# Patient Record
Sex: Female | Born: 1960 | Race: Black or African American | Hispanic: No | Marital: Married | Smoking: Current some day smoker
Health system: Southern US, Community
[De-identification: ages and names within clinical notes are randomized; demographics above are authoritative.]

## PROBLEM LIST (undated history)

## (undated) DIAGNOSIS — E119 Type 2 diabetes mellitus without complications: Secondary | ICD-10-CM

## (undated) DIAGNOSIS — I10 Essential (primary) hypertension: Secondary | ICD-10-CM

---

## 2017-07-26 ENCOUNTER — Encounter (HOSPITAL_COMMUNITY): Payer: Self-pay

## 2017-07-26 ENCOUNTER — Emergency Department (HOSPITAL_COMMUNITY): Payer: BLUE CROSS/BLUE SHIELD

## 2017-07-26 ENCOUNTER — Emergency Department (HOSPITAL_COMMUNITY)
Admission: EM | Admit: 2017-07-26 | Discharge: 2017-07-26 | Disposition: A | Discharge status: Home / Self Care | Payer: BLUE CROSS/BLUE SHIELD | Attending: Emergency Medicine | Admitting: Emergency Medicine

## 2017-07-26 DIAGNOSIS — I10 Essential (primary) hypertension: Secondary | ICD-10-CM | POA: Diagnosis not present

## 2017-07-26 DIAGNOSIS — M7918 Myalgia, other site: Secondary | ICD-10-CM | POA: Insufficient documentation

## 2017-07-26 DIAGNOSIS — E119 Type 2 diabetes mellitus without complications: Secondary | ICD-10-CM | POA: Insufficient documentation

## 2017-07-26 DIAGNOSIS — F17200 Nicotine dependence, unspecified, uncomplicated: Secondary | ICD-10-CM | POA: Insufficient documentation

## 2017-07-26 HISTORY — DX: Essential (primary) hypertension: I10

## 2017-07-26 HISTORY — DX: Type 2 diabetes mellitus without complications: E11.9

## 2017-07-26 MED ORDER — METHOCARBAMOL 500 MG PO TABS: 500.0000 mg | ORAL_TABLET | Freq: Two times a day (BID) | ORAL | 0 refills | Status: DC

## 2017-07-26 MED ORDER — METHOCARBAMOL 500 MG PO TABS
500.0000 mg | ORAL_TABLET | Freq: Once | ORAL | Status: AC
  Administered 2017-07-26: 500 mg via ORAL
  Filled 2017-07-26: qty 1

## 2017-07-26 MED ORDER — IBUPROFEN 400 MG PO TABS
600.0000 mg | ORAL_TABLET | Freq: Once | ORAL | Status: AC
  Administered 2017-07-26: 600 mg via ORAL
  Filled 2017-07-26: qty 1
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
## 2017-07-26 NOTE — Discharge Instructions (Addendum)
Please read attached information. If you experience any new or worsening signs or symptoms please return to the emergency room for evaluation. Please follow-up with your primary care provider or specialist as discussed. Please use medication prescribed only as directed and discontinue taking if you have any concerning signs or symptoms.   °
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
## 2019-03-17 ENCOUNTER — Ambulatory Visit: Payer: Self-pay | Attending: Internal Medicine

## 2019-03-17 DIAGNOSIS — Z23 Encounter for immunization: Secondary | ICD-10-CM | POA: Insufficient documentation

## 2019-03-17 NOTE — Progress Notes (Signed)
   Covid-19 Vaccination Clinic  Name:  Trinetta Alemu    MRN: [id number] DOB: 21-Apr-1960  03/17/2019  Ms. Betsch was observed post Covid-19 immunization for 15 minutes without incidence. She was provided with Vaccine Information Sheet and instruction to access the V-Safe system.   Ms. Silman was instructed to call 911 with any severe reactions post vaccine: Marland Kitchen Difficulty breathing  . Swelling of your face and throat  . A fast heartbeat  . A bad rash all over your body  . Dizziness and weakness    Immunizations Administered    Name Date Dose VIS Date Route   Pfizer COVID-19 Vaccine 03/17/2019  9:48 AM 0.3 mL 12/29/2018 Intramuscular   Manufacturer: [employer], [employer]   Lot: [id number]   NDC: [address]-6

## 2019-03-24 ENCOUNTER — Emergency Department (HOSPITAL_COMMUNITY): Payer: 59

## 2019-03-24 ENCOUNTER — Other Ambulatory Visit: Payer: Self-pay

## 2019-03-24 ENCOUNTER — Encounter (HOSPITAL_COMMUNITY): Payer: Self-pay | Admitting: *Deleted

## 2019-03-24 ENCOUNTER — Emergency Department (HOSPITAL_COMMUNITY)
Admission: EM | Admit: 2019-03-24 | Discharge: 2019-03-25 | Disposition: A | Discharge status: Home / Self Care | Payer: 59 | Attending: Emergency Medicine | Admitting: Emergency Medicine

## 2019-03-24 DIAGNOSIS — R519 Headache, unspecified: Secondary | ICD-10-CM | POA: Diagnosis present

## 2019-03-24 DIAGNOSIS — I10 Essential (primary) hypertension: Secondary | ICD-10-CM | POA: Diagnosis not present

## 2019-03-24 DIAGNOSIS — R0602 Shortness of breath: Secondary | ICD-10-CM | POA: Diagnosis not present

## 2019-03-24 DIAGNOSIS — R0981 Nasal congestion: Secondary | ICD-10-CM | POA: Diagnosis not present

## 2019-03-24 DIAGNOSIS — E119 Type 2 diabetes mellitus without complications: Secondary | ICD-10-CM | POA: Diagnosis not present

## 2019-03-24 DIAGNOSIS — Z7984 Long term (current) use of oral hypoglycemic drugs: Secondary | ICD-10-CM | POA: Diagnosis not present

## 2019-03-24 DIAGNOSIS — Z566 Other physical and mental strain related to work: Secondary | ICD-10-CM

## 2019-03-24 DIAGNOSIS — R05 Cough: Secondary | ICD-10-CM | POA: Insufficient documentation

## 2019-03-24 DIAGNOSIS — J069 Acute upper respiratory infection, unspecified: Secondary | ICD-10-CM

## 2019-03-24 DIAGNOSIS — Z20822 Contact with and (suspected) exposure to covid-19: Secondary | ICD-10-CM | POA: Diagnosis not present

## 2019-03-24 DIAGNOSIS — Z79899 Other long term (current) drug therapy: Secondary | ICD-10-CM | POA: Diagnosis not present

## 2019-03-24 DIAGNOSIS — F17200 Nicotine dependence, unspecified, uncomplicated: Secondary | ICD-10-CM | POA: Diagnosis not present

## 2019-03-24 LAB — CBC
HCT: 40.4 % (ref 36.0–46.0)
Hemoglobin: 13.1 g/dL (ref 12.0–15.0)
MCH: 27.6 pg (ref 26.0–34.0)
MCHC: 32.4 g/dL (ref 30.0–36.0)
MCV: 85.1 fL (ref 80.0–100.0)
Platelets: 353 10*3/uL (ref 150–400)
RBC: 4.75 MIL/uL (ref 3.87–5.11)
RDW: 15.6 % — ABNORMAL HIGH (ref 11.5–15.5)
WBC: 8.3 10*3/uL (ref 4.0–10.5)
nRBC: 0 % (ref 0.0–0.2)

## 2019-03-24 LAB — BASIC METABOLIC PANEL
Anion gap: 10 (ref 5–15)
BUN: 13 mg/dL (ref 6–20)
CO2: 28 mmol/L (ref 22–32)
Calcium: 9.2 mg/dL (ref 8.9–10.3)
Chloride: 105 mmol/L (ref 98–111)
Creatinine, Ser: 1.03 mg/dL — ABNORMAL HIGH (ref 0.44–1.00)
GFR calc Af Amer: >60 mL/min (ref >60)
GFR calc non Af Amer: 59 mL/min — ABNORMAL LOW (ref >60)
Glucose, Bld: 136 mg/dL — ABNORMAL HIGH (ref 70–99)
Potassium: 4 mmol/L (ref 3.5–5.1)
Sodium: 143 mmol/L (ref 135–145)

## 2019-03-24 LAB — TROPONIN I (HIGH SENSITIVITY): Troponin I (High Sensitivity): 6 ng/L (ref <18)

## 2019-03-24 LAB — CBG MONITORING, ED: Glucose-Capillary: 138 mg/dL — ABNORMAL HIGH (ref 70–99)

## 2019-03-24 MED ORDER — SODIUM CHLORIDE 0.9% FLUSH: 3.0000 mL | Freq: Once | INTRAVENOUS | Status: DC

## 2019-03-24 NOTE — ED Triage Notes (Signed)
Headache for three weeks, cough, dizziness, weakness, chest pain, and congestion that started today. Unsure of fevers.

## 2019-03-25 ENCOUNTER — Emergency Department (HOSPITAL_COMMUNITY): Payer: 59

## 2019-03-25 LAB — SARS CORONAVIRUS 2 (TAT 6-24 HRS): SARS Coronavirus 2: NEGATIVE

## 2019-03-25 LAB — TROPONIN I (HIGH SENSITIVITY): Troponin I (High Sensitivity): 4 ng/L (ref <18)

## 2019-03-25 MED ORDER — METOCLOPRAMIDE HCL 5 MG/ML IJ SOLN
5.0000 mg | Freq: Once | INTRAMUSCULAR | Status: AC
  Administered 2019-03-25: 5 mg via INTRAMUSCULAR
  Filled 2019-03-25: qty 2

## 2019-03-25 MED ORDER — DIPHENHYDRAMINE HCL 50 MG/ML IJ SOLN
25.0000 mg | Freq: Once | INTRAMUSCULAR | Status: AC
  Administered 2019-03-25: 25 mg via INTRAMUSCULAR
  Filled 2019-03-25: qty 1

## 2019-03-25 MED ORDER — KETOROLAC TROMETHAMINE 30 MG/ML IJ SOLN
30.0000 mg | Freq: Once | INTRAMUSCULAR | Status: AC
  Administered 2019-03-25: 30 mg via INTRAMUSCULAR
  Filled 2019-03-25: qty 1

## 2019-03-25 NOTE — Discharge Instructions (Addendum)
Schedule follow-up with your primary care doctor for recheck of your blood pressure.

## 2019-03-25 NOTE — ED Provider Notes (Signed)
[facility] EMERGENCY DEPARTMENT Provider Note   CSN: [id number] Arrival date & time: 03/24/19  2100     History Chief Complaint  Patient presents with  . Chest Pain    Kristy Erickson is a 59 y.o. female.  Patient presents to the emergency department with multiple complaints.  Patient reports that she has been having daily headaches for 3 weeks.  She reports that she has been under a lot of stress at a new job and thinks this might be causing her headaches.  She generally wakes up with a headache and takes Tylenol.  By midday the headache has resolved when the Tylenol wears off the headache returns.  Headache is at times global, at other times in different areas of the head.  No vision change, neck pain, neck stiffness, fever.  She does not usually have headaches.  Over the last 24 hours she has now developed sinus congestion, cough, feeling shortness of breath.  She has not had any fevers.        Past Medical History:  Diagnosis Date  . Diabetes mellitus without complication (HCC)   . Hypertension     There are no problems to display for this patient.   History reviewed. No pertinent surgical history.   OB History   No obstetric history on file.     No family history on file.  Social History   Tobacco Use  . Smoking status: Current Some Day Smoker  . Smokeless tobacco: Never Used  Substance Use Topics  . Alcohol use: Not on file  . Drug use: Not on file    Home Medications Prior to Admission medications   Medication Sig Start Date End Date Taking? Authorizing Provider  atorvastatin (LIPITOR) 80 MG tablet Take 80 mg by mouth daily. 11/21/18  Yes [provider]  glimepiride (AMARYL) 4 MG tablet Take 4 mg by mouth daily. 01/14/19  Yes [provider]  metoprolol succinate (TOPROL-XL) 50 MG 24 hr tablet Take 50 mg by mouth daily. 11/21/18  Yes [provider]  pioglitazone (ACTOS) 30 MG tablet Take 30 mg by mouth daily.  11/21/18  Yes [provider]  methocarbamol (ROBAXIN) 500 MG tablet Take 1 tablet (500 mg total) by mouth 2 (two) times daily. Patient not taking: Reported on 03/25/2019 07/26/17   Eyvonne Mechanic, PA-C    Allergies    Patient has no known allergies.  Review of Systems   Review of Systems  HENT: Positive for congestion.   Respiratory: Positive for cough and shortness of breath.   Neurological: Positive for headaches.  All other systems reviewed and are negative.   Physical Exam Updated Vital Signs BP (!) 176/90   Pulse 73   Temp 98.2 F (36.8 C) (Oral)   Resp 18   Ht 5\' 2"  (1.575 m)   Wt 73.5 kg   SpO2 100%   BMI 29.63 kg/m   Physical Exam Vitals and nursing note reviewed.  Constitutional:      General: She is not in acute distress.    Appearance: Normal appearance. She is well-developed.  HENT:     Head: Normocephalic and atraumatic.     Right Ear: Hearing normal.     Left Ear: Hearing normal.     Nose: Nose normal.  Eyes:     Conjunctiva/sclera: Conjunctivae normal.     Pupils: Pupils are equal, round, and reactive to light.  Cardiovascular:     Rate and Rhythm: Regular rhythm.  Heart sounds: S1 normal and S2 normal. No murmur. No friction rub. No gallop.   Pulmonary:     Effort: Pulmonary effort is normal. No respiratory distress.     Breath sounds: Normal breath sounds.  Chest:     Chest wall: No tenderness.  Abdominal:     General: Bowel sounds are normal.     Palpations: Abdomen is soft.     Tenderness: There is no abdominal tenderness. There is no guarding or rebound. Negative signs include Murphy's sign and McBurney's sign.     Hernia: No hernia is present.  Musculoskeletal:        General: Normal range of motion.     Cervical back: Normal range of motion and neck supple.  Skin:    General: Skin is warm and dry.     Findings: No rash.  Neurological:     Mental Status: She is alert and oriented to person, place, and time.     GCS: GCS eye  subscore is 4. GCS verbal subscore is 5. GCS motor subscore is 6.     Cranial Nerves: No cranial nerve deficit.     Sensory: No sensory deficit.     Coordination: Coordination normal.  Psychiatric:        Speech: Speech normal.        Behavior: Behavior normal.        Thought Content: Thought content normal.     ED Results / Procedures / Treatments   Labs (all labs ordered are listed, but only abnormal results are displayed) Labs Reviewed  BASIC METABOLIC PANEL - Abnormal; Notable for the following components:      Result Value   Glucose, Bld 136 (*)    Creatinine, Ser 1.03 (*)    GFR calc non Af Amer 59 (*)    All other components within normal limits  CBC - Abnormal; Notable for the following components:   RDW 15.6 (*)    All other components within normal limits  CBG MONITORING, ED - Abnormal; Notable for the following components:   Glucose-Capillary 138 (*)    All other components within normal limits  TROPONIN I (HIGH SENSITIVITY)  TROPONIN I (HIGH SENSITIVITY)    EKG EKG Interpretation  Date/Time:  Sunday March 25 2019 03:56:41 EST Ventricular Rate:  70 PR Interval:  122 QRS Duration: 79 QT Interval:  472 QTC Calculation: 510 R Axis:   10 Text Interpretation: Sinus rhythm Probable left atrial enlargement Consider anterior infarct Confirmed by Gilda Crease [phone]) on 03/25/2019 5:07:43 AM   Radiology CT HEAD WO CONTRAST  Result Date: 03/25/2019 CLINICAL DATA:  Acute headache with normal neurological exam. EXAM: CT HEAD WITHOUT CONTRAST TECHNIQUE: Contiguous axial images were obtained from the base of the skull through the vertex without intravenous contrast. COMPARISON:  None. FINDINGS: Brain: The brain shows a normal appearance without evidence of malformation, atrophy, old or acute small or large vessel infarction, mass lesion, hemorrhage, hydrocephalus or extra-axial collection. Vascular: No hyperdense vessel. No evidence of atherosclerotic calcification.  Skull: Normal.  No traumatic finding.  No focal bone lesion. Sinuses/Orbits: Sinuses are clear. Orbits appear normal. Mastoids are clear. Other: None significant IMPRESSION: Normal head CT.  No cause of headache identified. Electronically Signed   By: Paulina Fusi M.D.   On: 03/25/2019 05:20   DG Chest Portable 1 View  Result Date: 03/24/2019 CLINICAL DATA:  Chest pain and cough. EXAM: PORTABLE CHEST 1 VIEW COMPARISON:  None. FINDINGS: The heart size and mediastinal  contours are within normal limits. Both lungs are clear. The visualized skeletal structures are unremarkable. IMPRESSION: No active disease. Electronically Signed   By: Constance Holster M.D.   On: 03/24/2019 21:41    Procedures Procedures (including critical care time)  Medications Ordered in ED Medications  sodium chloride flush (NS) 0.9 % injection 3 mL (has no administration in time range)  ketorolac (TORADOL) 30 MG/ML injection 30 mg (has no administration in time range)  metoCLOPramide (REGLAN) injection 5 mg (has no administration in time range)  diphenhydrAMINE (BENADRYL) injection 25 mg (has no administration in time range)    ED Course  I have reviewed the triage vital signs and the nursing notes.  Pertinent labs & imaging results that were available during my care of the patient were reviewed by me and considered in my medical decision making (see chart for details).    MDM Rules/Calculators/A&P                      Patient complaining of 3 weeks of daily headache.  Headache does resolve with OTC pain medications but then comes back.  Headache is generally gradual in onset, no red flag symptoms.  She does not, however, regularly suffer headaches.  She therefore underwent CT head which was unremarkable.  She has a normal neurologic exam.  Treated with Toradol, Reglan, Benadryl and can follow-up as an outpatient with primary care.  Patient also complaining of URI symptoms.  She does have nasal congestion and cough.   Cough is nonproductive, witnessed in the exam room.  Chest x-ray is clear.  Lungs are clear to auscultation.  Symptoms are not consistent with cardiac etiology and her work-up has been negative.  Will provide Covid testing, treat URI symptoms. Final Clinical Impression(s) / ED Diagnoses Final diagnoses:  Nonintractable headache, unspecified chronicity pattern, unspecified headache type  Upper respiratory tract infection, unspecified type  Stress at work    Rx / DC Orders ED Discharge Orders    None       Johntavius Shepard, Gwenyth Allegra, MD 03/25/19 [phone]

## 2019-04-07 ENCOUNTER — Ambulatory Visit: Payer: 59 | Attending: Internal Medicine

## 2019-04-07 DIAGNOSIS — Z23 Encounter for immunization: Secondary | ICD-10-CM

## 2019-04-07 NOTE — Progress Notes (Signed)
   Covid-19 Vaccination Clinic  Name:  Kristy Erickson    MRN: [id number] DOB: 07/10/1960  04/07/2019  Kristy Erickson was observed post Covid-19 immunization for 15 minutes without incident. She was provided with Vaccine Information Sheet and instruction to access the V-Safe system.   Kristy Erickson was instructed to call 911 with any severe reactions post vaccine: Marland Kitchen Difficulty breathing  . Swelling of face and throat  . A fast heartbeat  . A bad rash all over body  . Dizziness and weakness   Immunizations Administered    Name Date Dose VIS Date Route   Pfizer COVID-19 Vaccine 04/07/2019 10:14 AM 0.3 mL 12/29/2018 Intramuscular   Manufacturer: [employer], [employer]   Lot: [id number]   NDC: [address]-9

## 2019-04-11 ENCOUNTER — Ambulatory Visit: Payer: Self-pay

## 2019-05-04 ENCOUNTER — Encounter (HOSPITAL_COMMUNITY): Payer: Self-pay

## 2019-05-04 ENCOUNTER — Ambulatory Visit (HOSPITAL_COMMUNITY)
Admission: EM | Admit: 2019-05-04 | Discharge: 2019-05-04 | Disposition: A | Discharge status: Home / Self Care | Payer: 59 | Attending: Emergency Medicine | Admitting: Emergency Medicine

## 2019-05-04 ENCOUNTER — Other Ambulatory Visit: Payer: Self-pay

## 2019-05-04 DIAGNOSIS — R519 Headache, unspecified: Secondary | ICD-10-CM

## 2019-05-04 DIAGNOSIS — J302 Other seasonal allergic rhinitis: Secondary | ICD-10-CM | POA: Diagnosis not present

## 2019-05-04 LAB — GLUCOSE, CAPILLARY: Glucose-Capillary: 139 mg/dL — ABNORMAL HIGH (ref 70–99)

## 2019-05-04 LAB — CBG MONITORING, ED: Glucose-Capillary: 139 mg/dL — ABNORMAL HIGH (ref 70–99)

## 2019-05-04 MED ORDER — CETIRIZINE HCL 10 MG PO CAPS: 10.0000 mg | ORAL_CAPSULE | Freq: Every day | ORAL | 0 refills | Status: AC

## 2019-05-04 MED ORDER — BENZONATATE 200 MG PO CAPS: 200.0000 mg | ORAL_CAPSULE | Freq: Three times a day (TID) | ORAL | 0 refills | Status: AC | PRN

## 2019-05-04 MED ORDER — PIOGLITAZONE HCL 30 MG PO TABS: 30.0000 mg | ORAL_TABLET | Freq: Every day | ORAL | 0 refills | Status: AC

## 2019-05-04 MED ORDER — NAPROXEN 500 MG PO TABS: 500.0000 mg | ORAL_TABLET | Freq: Two times a day (BID) | ORAL | 0 refills | Status: AC | PRN

## 2019-05-04 MED ORDER — FLUTICASONE PROPIONATE 50 MCG/ACT NA SUSP: 1.0000 | Freq: Every day | NASAL | 0 refills | Status: AC

## 2019-05-04 NOTE — Discharge Instructions (Signed)
May try using cetirizine/Zyrtec as alternative to Claritin/loratadine Add in Flonase nasal spray 1 to 2 spray in each nostril daily Tessalon/benzonatate every 8 hours for cough Naprosyn twice daily for headache  Please continue blood pressure medicine as prescribed, monitor blood pressure at home, follow-up with primary care for blood pressure recheck in 1 to 2 weeks Please restart taking diabetes medicine-blood sugar today 139  If you develop any worsening headache, vision changes, difficulty speaking, confusion, weakness, chest pain please follow-up in the emergency room

## 2019-05-04 NOTE — ED Provider Notes (Addendum)
[facility]    CSN: [id number] Arrival date & time: 05/04/19  1045      History   Chief Complaint Chief Complaint  Patient presents with  . Headache  . Dizziness    HPI Kristy Erickson is a 59 y.o. female history of hypertension, DM type II, presenting today for evaluation of headache and allergy symptoms.  Patient notes that she has had a frequent headache for the past 1 to 2 months.  She has had daily headaches, today her headache is located in her left temporal area, but are migratory around all areas of head.  Will have occasional blurry vision, but denies at present.  Denies associated one-sided weakness.  Does report generalized fatigue/weakness.  Denies fevers chills or body aches.  Denies chest pain or shortness of breath.  Has had some nasal congestion/rhinorrhea along with postnasal drainage.  Recently moved here from [place] and feels her allergies are worse than they have ever been in the past.  Using Claritin without relief.  Does have associated cough.  She took some Robitussin last night, believes this is the cause of her elevated blood pressure today.  States that it has been normal recently.  She does report that she has not been taking her blood pressure medicine of recently.  Did take metoprolol today.  Reports recent stress in her life.  Was seen in emergency room 1 month ago for similar and had negative CT scan of head.  Had negative Covid test at that time.  Plans to get established with PCP here. [facility] plans to work on this.   HPI  Past Medical History:  Diagnosis Date  . Diabetes mellitus without complication (HCC)   . Hypertension     There are no problems to display for this patient.   History reviewed. No pertinent surgical history.  OB History   No obstetric history on file.      Home Medications    Prior to Admission medications   Medication Sig Start Date End Date Taking? Authorizing Provider  atorvastatin (LIPITOR) 80 MG  tablet Take 80 mg by mouth daily. 11/21/18   [provider]  benzonatate (TESSALON) 200 MG capsule Take 1 capsule (200 mg total) by mouth 3 (three) times daily as needed for up to 7 days for cough. 05/04/19 05/11/19  Cong Hightower C, PA-C  Cetirizine HCl 10 MG CAPS Take 1 capsule (10 mg total) by mouth daily for 10 days. 05/04/19 05/14/19  Kolette Vey C, PA-C  fluticasone (FLONASE) 50 MCG/ACT nasal spray Place 1-2 sprays into both nostrils daily for 7 days. 05/04/19 05/11/19  Nakira Litzau C, PA-C  glimepiride (AMARYL) 4 MG tablet Take 4 mg by mouth daily. 01/14/19   [provider]  metoprolol succinate (TOPROL-XL) 50 MG 24 hr tablet Take 50 mg by mouth daily. 11/21/18   [provider]  naproxen (NAPROSYN) 500 MG tablet Take 1 tablet (500 mg total) by mouth 2 (two) times daily as needed for headache. 05/04/19   Lynisha Osuch C, PA-C  pioglitazone (ACTOS) 30 MG tablet Take 1 tablet (30 mg total) by mouth daily. 05/04/19   Vernestine Brodhead, Junius Creamer, PA-C    Family History History reviewed. No pertinent family history.  Social History Social History   Tobacco Use  . Smoking status: Current Some Day Smoker  . Smokeless tobacco: Never Used  Substance Use Topics  . Alcohol use: Not on file  . Drug use: Not on file     Allergies  Patient has no known allergies.   Review of Systems Review of Systems  Constitutional: Positive for fatigue. Negative for fever.  HENT: Positive for congestion and rhinorrhea. Negative for sinus pressure and sore throat.   Eyes: Negative for photophobia, pain and visual disturbance.  Respiratory: Positive for cough. Negative for shortness of breath.   Cardiovascular: Negative for chest pain.  Gastrointestinal: Negative for abdominal pain, nausea and vomiting.  Genitourinary: Negative for decreased urine volume and hematuria.  Musculoskeletal: Negative for myalgias, neck pain and neck stiffness.  Neurological: Positive for headaches.  Negative for dizziness, syncope, facial asymmetry, speech difficulty, weakness, light-headedness and numbness.     Physical Exam Triage Vital Signs ED Triage Vitals  Enc Vitals Group     BP 05/04/19 1123 (!) 194/90     Pulse Rate 05/04/19 1123 84     Resp 05/04/19 1123 19     Temp 05/04/19 1123 98.5 F (36.9 C)     Temp Source 05/04/19 1123 Oral     SpO2 05/04/19 1123 99 %     Weight --      Height --      Head Circumference --      Peak Flow --      Pain Score 05/04/19 1121 10     Pain Loc --      Pain Edu? --      Excl. in GC? --    No data found.  Updated Vital Signs BP (!) 194/90 (BP Location: Right Arm)   Pulse 84   Temp 98.5 F (36.9 C) (Oral)   Resp 19   SpO2 99%   Visual Acuity Right Eye Distance:   Left Eye Distance:   Bilateral Distance:    Right Eye Near:   Left Eye Near:    Bilateral Near:     Physical Exam Vitals and nursing note reviewed.  Constitutional:      General: She is not in acute distress.    Appearance: She is well-developed.  HENT:     Head: Normocephalic and atraumatic.     Ears:     Comments: Bilateral ears without tenderness to palpation of external auricle, tragus and mastoid, EAC's without erythema or swelling, TM's with good bony landmarks and cone of light. Non erythematous.     Nose:     Comments: Nasal mucosa pink, swollen turbinates, more prominent on right    Mouth/Throat:     Comments: Oral mucosa pink and moist, no tonsillar enlargement or exudate. Posterior pharynx patent and nonerythematous, no uvula deviation or swelling. Normal phonation. Eyes:     Extraocular Movements: Extraocular movements intact.     Conjunctiva/sclera: Conjunctivae normal.     Pupils: Pupils are equal, round, and reactive to light.  Cardiovascular:     Rate and Rhythm: Normal rate and regular rhythm.     Heart sounds: No murmur.  Pulmonary:     Effort: Pulmonary effort is normal. No respiratory distress.     Breath sounds: Normal breath  sounds.     Comments: Breathing comfortably at rest, CTABL, no wheezing, rales or other adventitious sounds auscultated Abdominal:     Palpations: Abdomen is soft.     Tenderness: There is no abdominal tenderness.  Musculoskeletal:     Cervical back: Neck supple.  Skin:    General: Skin is warm and dry.  Neurological:     General: No focal deficit present.     Mental Status: She is alert and oriented to person, place,  and time. Mental status is at baseline.     Comments: Patient A&O x3, cranial nerves II-XII grossly intact, strength at shoulders, hips and knees 5/5, equal bilaterally, patellar reflex 2+ bilaterally.Negative Romberg . Gait without abnormality.      UC Treatments / Results  Labs (all labs ordered are listed, but only abnormal results are displayed) Labs Reviewed  GLUCOSE, CAPILLARY - Abnormal; Notable for the following components:      Result Value   Glucose-Capillary 139 (*)    All other components within normal limits  CBG MONITORING, ED - Abnormal; Notable for the following components:   Glucose-Capillary 139 (*)    All other components within normal limits    EKG   Radiology No results found.  Procedures Procedures (including critical care time)  Medications Ordered in UC Medications - No data to display  Initial Impression / Assessment and Plan / UC Course  I have reviewed the triage vital signs and the nursing notes.  Pertinent labs & imaging results that were available during my care of the patient were reviewed by me and considered in my medical decision making (see chart for details).     Blood pressure elevated today, remaining elevated at 192/93 on recheck.  No neuro deficits, no red flags for headache.  Offered migraine cocktail, patient wished to proceed mainly with oral medicines.  Will provide Naprosyn for headache.  Continue blood pressure medicine as prescribed and establish care with PCP for blood pressure recheck in 1 to 2 weeks.   Discussed warning signs to follow-up with emergency room for.  Also with persistent allergy symptoms.  Will switch to Zyrtec as alternative and try Flonase, Tessalon for cough.  Rest and fluids.  Refilled pioglitazone.  Blood sugar today 139.  Follow-up with PCP for further diabetes monitoring and management.  Discussed strict return precautions. Patient verbalized understanding and is agreeable with plan.  Final Clinical Impressions(s) / UC Diagnoses   Final diagnoses:  Acute nonintractable headache, unspecified headache type  Seasonal allergic rhinitis, unspecified trigger     Discharge Instructions     May try using cetirizine/Zyrtec as alternative to Claritin/loratadine Add in Flonase nasal spray 1 to 2 spray in each nostril daily Tessalon/benzonatate every 8 hours for cough Naprosyn twice daily for headache  Please continue blood pressure medicine as prescribed, monitor blood pressure at home, follow-up with primary care for blood pressure recheck in 1 to 2 weeks Please restart taking diabetes medicine-blood sugar today 139  If you develop any worsening headache, vision changes, difficulty speaking, confusion, weakness, chest pain please follow-up in the emergency room   ED Prescriptions    Medication Sig Dispense Auth. Provider   pioglitazone (ACTOS) 30 MG tablet Take 1 tablet (30 mg total) by mouth daily. 30 tablet Shanetta Nicolls C, PA-C   naproxen (NAPROSYN) 500 MG tablet Take 1 tablet (500 mg total) by mouth 2 (two) times daily as needed for headache. 30 tablet Modupe Shampine C, PA-C   benzonatate (TESSALON) 200 MG capsule Take 1 capsule (200 mg total) by mouth 3 (three) times daily as needed for up to 7 days for cough. 28 capsule Dominyk Law C, PA-C   fluticasone (FLONASE) 50 MCG/ACT nasal spray Place 1-2 sprays into both nostrils daily for 7 days. 1 g Aryan Sparks C, PA-C   Cetirizine HCl 10 MG CAPS Take 1 capsule (10 mg total) by mouth daily for 10 days. 10  capsule Willies Laviolette, Gamaliel C, PA-C     PDMP not reviewed  this encounter.   Joneen Caraway [place] C, PA-C 05/04/19 1326    Debara Pickett C, PA-C 05/04/19 1327

## 2019-05-04 NOTE — ED Triage Notes (Signed)
Pt presents to UC with headache x 1 month, lightheaded x 3 days.Pt reports she feels lightheaded when she is standing up.

## 2019-05-06 ENCOUNTER — Encounter (HOSPITAL_COMMUNITY): Payer: Self-pay

## 2019-05-06 ENCOUNTER — Other Ambulatory Visit: Payer: Self-pay

## 2019-05-06 ENCOUNTER — Emergency Department (HOSPITAL_COMMUNITY)
Admission: EM | Admit: 2019-05-06 | Discharge: 2019-05-06 | Disposition: A | Discharge status: Home / Self Care | Payer: 59 | Attending: Emergency Medicine | Admitting: Emergency Medicine

## 2019-05-06 DIAGNOSIS — Z7984 Long term (current) use of oral hypoglycemic drugs: Secondary | ICD-10-CM | POA: Diagnosis not present

## 2019-05-06 DIAGNOSIS — E119 Type 2 diabetes mellitus without complications: Secondary | ICD-10-CM | POA: Diagnosis not present

## 2019-05-06 DIAGNOSIS — F17210 Nicotine dependence, cigarettes, uncomplicated: Secondary | ICD-10-CM | POA: Diagnosis not present

## 2019-05-06 DIAGNOSIS — Z79899 Other long term (current) drug therapy: Secondary | ICD-10-CM | POA: Insufficient documentation

## 2019-05-06 DIAGNOSIS — R519 Headache, unspecified: Secondary | ICD-10-CM | POA: Diagnosis not present

## 2019-05-06 DIAGNOSIS — Z20822 Contact with and (suspected) exposure to covid-19: Secondary | ICD-10-CM | POA: Diagnosis not present

## 2019-05-06 DIAGNOSIS — I10 Essential (primary) hypertension: Secondary | ICD-10-CM | POA: Diagnosis present

## 2019-05-06 LAB — CBC WITH DIFFERENTIAL/PLATELET
Abs Immature Granulocytes: 0.03 10*3/uL (ref 0.00–0.07)
Basophils Absolute: 0 10*3/uL (ref 0.0–0.1)
Basophils Relative: 0 %
Eosinophils Absolute: 0.3 10*3/uL (ref 0.0–0.5)
Eosinophils Relative: 3 %
HCT: 42.5 % (ref 36.0–46.0)
Hemoglobin: 13.5 g/dL (ref 12.0–15.0)
Immature Granulocytes: 0 %
Lymphocytes Relative: 30 %
Lymphs Abs: 2.9 10*3/uL (ref 0.7–4.0)
MCH: 28.1 pg (ref 26.0–34.0)
MCHC: 31.8 g/dL (ref 30.0–36.0)
MCV: 88.4 fL (ref 80.0–100.0)
Monocytes Absolute: 0.5 10*3/uL (ref 0.1–1.0)
Monocytes Relative: 6 %
Neutro Abs: 5.8 10*3/uL (ref 1.7–7.7)
Neutrophils Relative %: 61 %
Platelets: 365 10*3/uL (ref 150–400)
RBC: 4.81 MIL/uL (ref 3.87–5.11)
RDW: 15.1 % (ref 11.5–15.5)
WBC: 9.6 10*3/uL (ref 4.0–10.5)
nRBC: 0 % (ref 0.0–0.2)

## 2019-05-06 LAB — BASIC METABOLIC PANEL
Anion gap: 8 (ref 5–15)
BUN: 16 mg/dL (ref 6–20)
CO2: 30 mmol/L (ref 22–32)
Calcium: 9.7 mg/dL (ref 8.9–10.3)
Chloride: 107 mmol/L (ref 98–111)
Creatinine, Ser: 1.04 mg/dL — ABNORMAL HIGH (ref 0.44–1.00)
GFR calc Af Amer: >60 mL/min (ref >60)
GFR calc non Af Amer: 58 mL/min — ABNORMAL LOW (ref >60)
Glucose, Bld: 134 mg/dL — ABNORMAL HIGH (ref 70–99)
Potassium: 4.4 mmol/L (ref 3.5–5.1)
Sodium: 145 mmol/L (ref 135–145)

## 2019-05-06 MED ORDER — HYDROCHLOROTHIAZIDE 12.5 MG PO TABS: 12.5000 mg | ORAL_TABLET | Freq: Every day | ORAL | 0 refills | Status: AC

## 2019-05-06 MED ORDER — HYDROCHLOROTHIAZIDE 12.5 MG PO CAPS
12.5000 mg | ORAL_CAPSULE | Freq: Once | ORAL | Status: AC
  Administered 2019-05-06: 12.5 mg via ORAL
  Filled 2019-05-06: qty 1

## 2019-05-06 NOTE — ED Triage Notes (Signed)
Seen at urgent care yesterday for headache of a month. They gave her a rx naproxen for headache. Still having ongoing hypertension not responding to her current meds.

## 2019-05-06 NOTE — ED Notes (Signed)
Spoke to lab about delay in blood work, stating they have the samples but are delayed.

## 2019-05-06 NOTE — ED Provider Notes (Signed)
[facility] DEPT Provider Note   CSN: [id number] Arrival date & time: 05/06/19  1908     History Chief Complaint  Patient presents with  . Hypertension    Kristy Erickson is a 59 y.o. female.  HPI      Kristy Erickson is a 59 y.o. female, with a history of DM and HTN, presenting to the ED with headaches for the last month or two, but worse over the last week.   She moved from [place], [place] in Jan, ran out of her metoprolol, and was only able to get it filled again last week. Last dose was this morning around 10 AM.  She states she has been working to establish care with a new PCP here in the area.  She submitted her paperwork to a new practice and they said they should be able to get her in with Dr. Lavonia Drafts next week.  She states her headaches have been coming on usually around 12 pm, takes Excedrin, and improve after she leaves work around [employer]. She also notes she had previously been drinking caffeine each morning until 2 weeks ago, now has no caffeine intake except when she takes the Excedrin. The pain is typically left temporal, improves with rubbing and putting pressure on the left temple, radiates around the head like a band, currently no headache, but ranges up to 10/10.  Occasional blurry vision bilaterally.  She states she suspects her headaches are due to allergies from the pollen in the air. She has been experiencing nasal congestion, rhinorrhea, and dry cough for about the last month. 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  Denies fever/chills, N/V/D, chest pain, shortness of breath, urinary difficulty, urinary symptoms, dizziness, syncope, neuro deficits, or any other complaints.    Past Medical History:  Diagnosis Date  . Diabetes mellitus without complication ([place])   . Hypertension     There are no problems to display for this patient.   History reviewed. No pertinent  surgical history.   OB History   No obstetric history on file.     No family history on file.  Social History   Tobacco Use  . Smoking status: Current Some Day Smoker  . Smokeless tobacco: Never Used  Substance Use Topics  . Alcohol use: Not on file  . Drug use: Not on file    Home Medications Prior to Admission medications   Medication Sig Start Date End Date Taking? Authorizing Provider  atorvastatin (LIPITOR) 80 MG tablet Take 80 mg by mouth daily. 11/21/18   [provider]  benzonatate (TESSALON) 200 MG capsule Take 1 capsule (200 mg total) by mouth 3 (three) times daily as needed for up to 7 days for cough. 05/04/19 05/11/19  Wieters, Hallie C, PA-C  Cetirizine HCl 10 MG CAPS Take 1 capsule (10 mg total) by mouth daily for 10 days. 05/04/19 05/14/19  Wieters, Hallie C, PA-C  fluticasone (FLONASE) 50 MCG/ACT nasal spray Place 1-2 sprays into both nostrils daily for 7 days. 05/04/19 05/11/19  Wieters, Hallie C, PA-C  glimepiride (AMARYL) 4 MG tablet Take 4 mg by mouth daily. 01/14/19   [provider]  hydrochlorothiazide (HYDRODIURIL) 12.5 MG tablet Take 1 tablet (12.5 mg total) by mouth daily. 05/06/19 06/05/19  Kristinia Leavy C, PA-C  metoprolol succinate (TOPROL-XL) 50 MG 24 hr tablet Take 50 mg by mouth daily. 11/21/18   [provider]  naproxen (NAPROSYN) 500 MG tablet Take 1 tablet (500 mg total) by mouth 2 (two) times daily as needed for headache. 05/04/19   Wieters, Hallie C, PA-C  pioglitazone (ACTOS) 30 MG tablet Take 1 tablet (30 mg total) by mouth daily. 05/04/19   Wieters, Hallie C, PA-C    Allergies    Bee pollen and Amlodipine  Review of Systems   Review of Systems  Constitutional: Negative for chills, diaphoresis and fever.  HENT: Positive for congestion, rhinorrhea and sneezing. Negative for sore throat.   Respiratory: Positive for cough. Negative for shortness of breath.   Cardiovascular: Negative for chest pain and leg swelling.    Gastrointestinal: Negative for abdominal pain, diarrhea, nausea and vomiting.  Genitourinary: Negative for difficulty urinating.  Neurological: Positive for headaches. Negative for dizziness, syncope, weakness, light-headedness and numbness.  All other systems reviewed and are negative.   Physical Exam Updated Vital Signs BP (!) 201/100 (BP Location: Left Arm)   Pulse 88   Temp 98.5 F (36.9 C)   Resp 16   Ht 5\' 1"  (1.549 m)   Wt 73.5 kg   SpO2 92%   BMI 30.61 kg/m   Physical Exam Vitals and nursing note reviewed.  Constitutional:      General: She is not in acute distress.    Appearance: She is well-developed. She is not diaphoretic.  HENT:     Head: Normocephalic and atraumatic.     Mouth/Throat:     Mouth: Mucous membranes are moist.     Pharynx: Oropharynx is clear.  Eyes:     Conjunctiva/sclera: Conjunctivae normal.  Cardiovascular:     Rate and Rhythm: Normal rate and regular rhythm.     Pulses: Normal pulses.          Radial pulses are 2+ on the right side and 2+ on the left side.       Dorsalis pedis pulses are 2+ on the right side and 2+ on the left side.       Posterior tibial pulses are 2+ on the right side and 2+ on the left side.     Heart sounds: Normal heart sounds.     Comments: Tactile temperature in the extremities appropriate and equal bilaterally. Pulmonary:     Effort: Pulmonary effort is normal. No respiratory distress.     Breath sounds: Normal breath sounds.  Abdominal:     Palpations: Abdomen is soft.     Tenderness: There is no abdominal tenderness. There is no guarding.  Musculoskeletal:     Cervical back: Neck supple.     Right lower leg: No edema.     Left lower leg: No edema.  Lymphadenopathy:     Cervical: No cervical adenopathy.  Skin:    General: Skin is warm and dry.  Neurological:     Mental Status: She is alert and oriented to person, place, and time.     Comments: No noted acute cognitive deficit. Sensation grossly intact  to light touch in the extremities.   Grip strengths equal bilaterally.   Strength 5/5 in all extremities.  No gait disturbance.  Coordination intact.  Cranial nerves III-XII grossly intact.  Handles oral secretions without noted difficulty.  No noted phonation or speech deficit. No facial droop.   Psychiatric:        Mood and Affect: Mood and affect normal.        Speech: Speech normal.        Behavior: Behavior normal.  ED Results / Procedures / Treatments   Labs (all labs ordered are listed, but only abnormal results are displayed) Labs Reviewed  BASIC METABOLIC PANEL - Abnormal; Notable for the following components:      Result Value   Glucose, Bld 134 (*)    Creatinine, Ser 1.04 (*)    GFR calc non Af Amer 58 (*)    All other components within normal limits  SARS CORONAVIRUS 2 (TAT 6-24 HRS)  CBC WITH DIFFERENTIAL/PLATELET    BUN  Date Value Ref Range Status  05/06/2019 16 6 - 20 mg/dL Final  46/27/0350 13 6 - 20 mg/dL Final   Creatinine, Ser  Date Value Ref Range Status  05/06/2019 1.04 (H) 0.44 - 1.00 mg/dL Final  09/38/1829 9.37 (H) 0.44 - 1.00 mg/dL Final     EKG None  Radiology No results found.  Procedures Procedures (including critical care time)  Medications Ordered in ED Medications  hydrochlorothiazide (MICROZIDE) capsule 12.5 mg (12.5 mg Oral Given 05/06/19 2139)    ED Course  I have reviewed the triage vital signs and the nursing notes.  Pertinent labs & imaging results that were available during my care of the patient were reviewed by me and considered in my medical decision making (see chart for details).    MDM Rules/Calculators/A&P                      Patient presents with intermittent headaches.  Patient is nontoxic appearing, afebrile, not tachycardic, not tachypneic, not hypotensive, maintains excellent SPO2 on room air, and is in no apparent distress.  No neurologic deficits.  She did not have a headache upon my initial  interview and did not develop a headache through the rest of the ED course. I have reviewed the patient's chart to obtain more information.  I reviewed recent visits and notes. I reviewed and interpreted the patient's labs. A few possible things could be contributing to patient's headaches.  Stress, environmental/allergy factors, change in caffeine intake, and her hypertension.   We discussed each of these factors and that a combination of factors could be responsible.  I do have some concern for how high the patient's blood pressure is here in the ED despite her recent compliance with her metoprolol XL and that she is symptomatic to this hypertension.  She states she thinks she should be able to establish care with her new PCP next week, but is not certain.  The patient was given instructions for home care as well as return precautions. Patient voices understanding of these instructions, accepts the plan, and is comfortable with discharge.   Vitals:   05/06/19 1919 05/06/19 2030 05/06/19 2045 05/06/19 2100  BP:  (!) 184/98  (!) 200/104  Pulse:  66 62 66  Resp:  (!) 25 (!) 23 (!) 21  Temp:      SpO2:  100% 100% 100%  Weight: 73.5 kg     Height: 5\' 1"  (1.549 m)      Vitals:   05/06/19 2045 05/06/19 2100 05/06/19 2130 05/06/19 2328  BP:  (!) 200/104 (!) 168/105 (!) 165/95  Pulse: 62 66 64 (!) 52  Resp: (!) 23 (!) 21 16 16   Temp:      SpO2: 100% 100% 100% 99%  Weight:      Height:         Final Clinical Impression(s) / ED Diagnoses Final diagnoses:  Hypertension, unspecified type  Recurrent headache    Rx / DC  Orders ED Discharge Orders         Ordered    hydrochlorothiazide (HYDRODIURIL) 12.5 MG tablet  Daily     05/06/19 2329           Concepcion Living 05/06/19 2333    Linwood Dibbles, MD 05/07/19 1042

## 2019-05-06 NOTE — Discharge Instructions (Addendum)
Headache Your lab results showed no acute abnormalities.  For future headaches please try the following regimen: Antiinflammatory medications: Take 600 mg of ibuprofen every 6 hours or 440 mg (over the counter dose) to 500 mg (prescription dose) of naproxen every 12 hours.  Use this regimen until headache subsides for up to 3 days .  After this time, these medications may be used as needed for pain. Take these medications with food to avoid upset stomach. Choose only one of these medications, do not take them together. Acetaminophen: Should you continue to have additional pain while taking the ibuprofen or naproxen, you may add in acetaminophen (generic for Tylenol) as needed. Your daily total maximum amount of acetaminophen from all sources should be limited to 4000mg /day for persons without liver problems, or 2000mg /day for those with liver problems.  Hydration: Have a goal of about a half liter of water every couple hours to stay well hydrated.   Sleep: Please be sure to get plenty of sleep with a goal of 8 hours per night. Having a regular bed time and bedtime routine can help with this.  Screens: Reduce the amount of time you are in front of screens.  Take about a 5-10-minute break every hour or every couple hours to give your eyes rest.  Do not use screens in dark rooms.  Glasses with a blue light filter may also help reduce eye fatigue.  Stress: Take steps to reduce stress as much as possible.  Caffeine: Caffeine withdrawal can certainly cause headaches.  Hydrochlorothiazide (HCTZ): This medication is meant to supplement your current high blood pressure medication.  Discontinue this medication for nausea, vomiting, dizziness, passing out, difficulty urinating, changes in urination.  Follow up: Follow-up with your primary care provider on this issue.  May also need to follow-up with the neurologist for increased frequency of headaches.

## 2019-05-07 LAB — SARS CORONAVIRUS 2 (TAT 6-24 HRS): SARS Coronavirus 2: NEGATIVE
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
## 2019-12-19 ENCOUNTER — Ambulatory Visit (HOSPITAL_COMMUNITY)
Admission: EM | Admit: 2019-12-19 | Discharge: 2019-12-19 | Disposition: A | Discharge status: Home / Self Care | Payer: 59 | Attending: Emergency Medicine | Admitting: Emergency Medicine

## 2019-12-19 ENCOUNTER — Ambulatory Visit (INDEPENDENT_AMBULATORY_CARE_PROVIDER_SITE_OTHER): Payer: 59

## 2019-12-19 ENCOUNTER — Other Ambulatory Visit: Payer: Self-pay

## 2019-12-19 ENCOUNTER — Encounter (HOSPITAL_COMMUNITY): Payer: Self-pay

## 2019-12-19 DIAGNOSIS — M25561 Pain in right knee: Secondary | ICD-10-CM

## 2019-12-19 MED ORDER — TRAMADOL HCL 50 MG PO TABS: 50.0000 mg | ORAL_TABLET | Freq: Three times a day (TID) | ORAL | 0 refills | Status: AC | PRN

## 2019-12-19 MED ORDER — LISINOPRIL 10 MG PO TABS: 10.0000 mg | ORAL_TABLET | Freq: Every day | ORAL | 0 refills | Status: AC

## 2019-12-19 NOTE — Discharge Instructions (Addendum)
Take tramadol for pain. Take lisinopril once daily for hypertension. Continue to use metoprolol.
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
## 2020-01-28 ENCOUNTER — Ambulatory Visit (HOSPITAL_COMMUNITY)
Admission: EM | Admit: 2020-01-28 | Discharge: 2020-01-28 | Disposition: A | Discharge status: Home / Self Care | Payer: 59 | Attending: Family Medicine | Admitting: Family Medicine

## 2020-01-28 ENCOUNTER — Other Ambulatory Visit: Payer: Self-pay

## 2020-01-28 ENCOUNTER — Encounter (HOSPITAL_COMMUNITY): Payer: Self-pay

## 2020-01-28 DIAGNOSIS — M79602 Pain in left arm: Secondary | ICD-10-CM | POA: Diagnosis not present

## 2020-01-28 MED ORDER — CYCLOBENZAPRINE HCL 5 MG PO TABS: 5.0000 mg | ORAL_TABLET | Freq: Three times a day (TID) | ORAL | 0 refills | Status: AC | PRN

## 2020-01-28 MED ORDER — PREDNISONE 20 MG PO TABS: 40.0000 mg | ORAL_TABLET | Freq: Every day | ORAL | 0 refills | Status: AC

## 2020-01-28 MED ORDER — KETOROLAC TROMETHAMINE 60 MG/2ML IM SOLN: 60.0000 mg | Freq: Once | INTRAMUSCULAR | Status: DC

## 2020-01-28 NOTE — ED Triage Notes (Signed)
Patient states she has had left arm pain radiating from her upper arm to her hand since Saturday. Pt is aox4 and ambulatory.

## 2020-01-28 NOTE — ED Provider Notes (Signed)
[facility]    CSN: [id number] Arrival date & time: 01/28/20  4196      History   Chief Complaint Chief Complaint  Patient presents with  . Arm Pain    Since saturday    HPI Kristy Erickson is a 60 y.o. female.   Here today with 3 days of left arm pain from shoulder radiating down to hand. Some numbness, tingling and weakness in the arm. Pain much worse with movement or attempt to rotate or squeeze with hand. Denies swelling, discoloration, known injury to area, history of neck or shoulder issues. Tried some tylenol which didn't seem to help.      Past Medical History:  Diagnosis Date  . Diabetes mellitus without complication (HCC)   . Hypertension     There are no problems to display for this patient.   History reviewed. No pertinent surgical history.  OB History   No obstetric history on file.      Home Medications    Prior to Admission medications   Medication Sig Start Date End Date Taking? Authorizing Provider  atorvastatin (LIPITOR) 80 MG tablet Take 80 mg by mouth daily. 11/21/18  Yes [provider]  cyclobenzaprine (FLEXERIL) 5 MG tablet Take 1 tablet (5 mg total) by mouth 3 (three) times daily as needed for muscle spasms. 01/28/20  Yes Particia Nearing, PA-C  lisinopril (ZESTRIL) 10 MG tablet Take 1 tablet (10 mg total) by mouth daily. 12/19/19 01/18/20 Yes Pia Mau M, PA-C  metoprolol succinate (TOPROL-XL) 50 MG 24 hr tablet Take 50 mg by mouth daily. 11/21/18  Yes [provider]  predniSONE (DELTASONE) 20 MG tablet Take 2 tablets (40 mg total) by mouth daily with breakfast. 01/28/20  Yes Particia Nearing, PA-C  Cetirizine HCl 10 MG CAPS Take 1 capsule (10 mg total) by mouth daily for 10 days. 05/04/19 05/14/19  Wieters, Hallie C, PA-C  fluticasone (FLONASE) 50 MCG/ACT nasal spray Place 1-2 sprays into both nostrils daily for 7 days. 05/04/19 05/11/19  Wieters, Hallie C, PA-C  glimepiride (AMARYL) 4 MG tablet Take 4  mg by mouth daily. 01/14/19   [provider]  hydrochlorothiazide (HYDRODIURIL) 12.5 MG tablet Take 1 tablet (12.5 mg total) by mouth daily. 05/06/19 06/05/19  Joy, Shawn C, PA-C  naproxen (NAPROSYN) 500 MG tablet Take 1 tablet (500 mg total) by mouth 2 (two) times daily as needed for headache. 05/04/19   Wieters, Hallie C, PA-C  pioglitazone (ACTOS) 30 MG tablet Take 1 tablet (30 mg total) by mouth daily. 05/04/19   Wieters, Junius Creamer, PA-C    Family History Family History  Family history unknown: Yes    Social History Social History   Tobacco Use  . Smoking status: Current Some Day Smoker  . Smokeless tobacco: Never Used  Vaping Use  . Vaping Use: Never used  Substance Use Topics  . Alcohol use: Yes  . Drug use: Never     Allergies   Bee pollen and Amlodipine   Review of Systems Review of Systems PER HPI    Physical Exam Triage Vital Signs ED Triage Vitals  Enc Vitals Group     BP 01/28/20 1104 (!) 213/91     Pulse Rate 01/28/20 1104 67     Resp 01/28/20 1104 18     Temp 01/28/20 1104 98 F (36.7 C)     Temp Source 01/28/20 1104 Oral     SpO2 01/28/20 1104 100 %     Weight --  Height --      Head Circumference --      Peak Flow --      Pain Score 01/28/20 1107 10     Pain Loc --      Pain Edu? --      Excl. in GC? --    No data found.  Updated Vital Signs BP (!) 213/91 (BP Location: Right Arm)   Pulse 67   Temp 98 F (36.7 C) (Oral)   Resp 18   SpO2 100%   Visual Acuity Right Eye Distance:   Left Eye Distance:   Bilateral Distance:    Right Eye Near:   Left Eye Near:    Bilateral Near:     Physical Exam Vitals and nursing note reviewed.  Constitutional:      Appearance: Normal appearance. She is not ill-appearing.  HENT:     Head: Atraumatic.  Eyes:     Extraocular Movements: Extraocular movements intact.     Conjunctiva/sclera: Conjunctivae normal.  Cardiovascular:     Rate and Rhythm: Normal rate and regular rhythm.      Pulses: Normal pulses.     Heart sounds: Normal heart sounds.  Pulmonary:     Effort: Pulmonary effort is normal.     Breath sounds: Normal breath sounds.  Musculoskeletal:        General: Tenderness (significant ttp anterior left shoulder down diffusely through musculature of left arm. ) present. No swelling or deformity. Normal range of motion.     Cervical back: Normal range of motion and neck supple.     Comments: No c spine ttp, good rotation in all directions of head  Skin:    General: Skin is warm and dry.     Findings: No bruising, erythema or rash.  Neurological:     Mental Status: She is alert and oriented to person, place, and time.     Sensory: No sensory deficit.  Psychiatric:        Mood and Affect: Mood normal.        Thought Content: Thought content normal.        Judgment: Judgment normal.      UC Treatments / Results  Labs (all labs ordered are listed, but only abnormal results are displayed) Labs Reviewed - No data to display  EKG   Radiology No results found.  Procedures Procedures (including critical care time)  Medications Ordered in UC Medications  ketorolac (TORADOL) injection 60 mg (has no administration in time range)    Initial Impression / Assessment and Plan / UC Course  I have reviewed the triage vital signs and the nursing notes.  Pertinent labs & imaging results that were available during my care of the patient were reviewed by me and considered in my medical decision making (see chart for details).     Suspect impingement syndrome of left shoulder, will treat with short burst of prednisone with precautions given regarding elevated BSs, flexeril prn also with precautions given for sedation, and IM toradol given here for pain. Work note, supportive home care reviewed.  Return for worsening or un resolving sxs.   Final Clinical Impressions(s) / UC Diagnoses   Final diagnoses:  Left arm pain   Discharge Instructions   None     ED Prescriptions    Medication Sig Dispense Auth. Provider   predniSONE (DELTASONE) 20 MG tablet Take 2 tablets (40 mg total) by mouth daily with breakfast. 6 tablet Particia Nearing, PA-C   cyclobenzaprine (  FLEXERIL) 5 MG tablet Take 1 tablet (5 mg total) by mouth 3 (three) times daily as needed for muscle spasms. 15 tablet Particia Nearing, [place]     PDMP not reviewed this encounter.   Particia Nearing, [place] 01/28/20 1211

## 2020-03-14 ENCOUNTER — Other Ambulatory Visit: Payer: Self-pay

## 2020-03-14 ENCOUNTER — Ambulatory Visit (HOSPITAL_COMMUNITY)
Admission: EM | Admit: 2020-03-14 | Discharge: 2020-03-14 | Disposition: A | Discharge status: Home / Self Care | Payer: 59 | Attending: Emergency Medicine | Admitting: Emergency Medicine

## 2020-03-14 ENCOUNTER — Encounter (HOSPITAL_COMMUNITY): Payer: Self-pay | Admitting: Emergency Medicine

## 2020-03-14 DIAGNOSIS — B349 Viral infection, unspecified: Secondary | ICD-10-CM | POA: Diagnosis not present

## 2020-03-14 DIAGNOSIS — I10 Essential (primary) hypertension: Secondary | ICD-10-CM

## 2020-03-14 MED ORDER — ONDANSETRON 4 MG PO TBDP: 4.0000 mg | ORAL_TABLET | Freq: Three times a day (TID) | ORAL | 0 refills | Status: AC | PRN

## 2020-03-14 MED ORDER — BENZONATATE 100 MG PO CAPS: 100.0000 mg | ORAL_CAPSULE | Freq: Three times a day (TID) | ORAL | 0 refills | Status: DC | PRN

## 2020-03-14 MED ORDER — ONDANSETRON 4 MG PO TBDP: 4.0000 mg | ORAL_TABLET | Freq: Three times a day (TID) | ORAL | 0 refills | Status: DC | PRN

## 2020-03-14 MED ORDER — BENZONATATE 100 MG PO CAPS: 100.0000 mg | ORAL_CAPSULE | Freq: Three times a day (TID) | ORAL | 0 refills | Status: AC | PRN

## 2020-03-14 NOTE — Discharge Instructions (Addendum)
Take the antinausea medication as directed.    Keep yourself hydrated with clear liquids, such as water, Gatorade, Pedialyte, Sprite, or ginger ale.    Take the [facility] as needed for cough.     Follow up with your primary care provider if your symptoms are not improving.    Your blood pressure is elevated today at 166/103.  Please have this rechecked by your primary care provider in 1-2 weeks.

## 2020-03-14 NOTE — ED Provider Notes (Addendum)
[facility]    CSN: [id number] Arrival date & time: 03/14/20  1726      History   Chief Complaint Chief Complaint  Patient presents with  . Cough  . Headache    HPI Kristy Erickson is a 60 y.o. female.   Patient presents with 2-week history of cough.  She states the cough is causing her to have a headache and occasionally vomit.  Last emesis episode today while here at the urgent care.  She denies fever, chills, shortness of breath, diarrhea, or other symptoms.  She requests a COVID test.  Her medical history includes hypertension and diabetes.    The history is provided by the patient and medical records.    Past Medical History:  Diagnosis Date  . Diabetes mellitus without complication (HCC)   . Hypertension     There are no problems to display for this patient.   History reviewed. No pertinent surgical history.  OB History   No obstetric history on file.      Home Medications    Prior to Admission medications   Medication Sig Start Date End Date Taking? Authorizing Provider  atorvastatin (LIPITOR) 80 MG tablet Take 80 mg by mouth daily. 11/21/18  Yes [provider]  glimepiride (AMARYL) 4 MG tablet Take 4 mg by mouth daily. 01/14/19  Yes [provider]  hydrochlorothiazide (HYDRODIURIL) 12.5 MG tablet Take 1 tablet (12.5 mg total) by mouth daily. 05/06/19 06/05/19 Yes Joy, Shawn C, PA-C  lisinopril (ZESTRIL) 10 MG tablet Take 1 tablet (10 mg total) by mouth daily. 12/19/19 01/18/20 Yes Pia Mau M, PA-C  metoprolol succinate (TOPROL-XL) 50 MG 24 hr tablet Take 50 mg by mouth daily. 11/21/18  Yes [provider]  predniSONE (DELTASONE) 20 MG tablet Take 2 tablets (40 mg total) by mouth daily with breakfast. 01/28/20  Yes Particia Nearing, PA-C  benzonatate (TESSALON) 100 MG capsule Take 1 capsule (100 mg total) by mouth 3 (three) times daily as needed for cough. 03/14/20   Mickie Bail, NP  Cetirizine HCl 10 MG CAPS Take 1  capsule (10 mg total) by mouth daily for 10 days. 05/04/19 05/14/19  Wieters, Hallie C, PA-C  cyclobenzaprine (FLEXERIL) 5 MG tablet Take 1 tablet (5 mg total) by mouth 3 (three) times daily as needed for muscle spasms. 01/28/20   Particia Nearing, PA-C  fluticasone [facility]) 50 MCG/ACT nasal spray Place 1-2 sprays into both nostrils daily for 7 days. 05/04/19 05/11/19  Wieters, Hallie C, PA-C  naproxen (NAPROSYN) 500 MG tablet Take 1 tablet (500 mg total) by mouth 2 (two) times daily as needed for headache. 05/04/19   Wieters, Hallie C, PA-C  ondansetron (ZOFRAN ODT) 4 MG disintegrating tablet Take 1 tablet (4 mg total) by mouth every 8 (eight) hours as needed for nausea or vomiting. 03/14/20   Mickie Bail, NP  pioglitazone (ACTOS) 30 MG tablet Take 1 tablet (30 mg total) by mouth daily. 05/04/19   Wieters, Junius Creamer, PA-C    Family History Family History  Family history unknown: Yes    Social History Social History   Tobacco Use  . Smoking status: Current Some Day Smoker  . Smokeless tobacco: Never Used  Vaping Use  . Vaping Use: Never used  Substance Use Topics  . Alcohol use: Yes  . Drug use: Never     Allergies   Bee pollen and Amlodipine   Review of Systems Review of Systems  Constitutional: Negative for chills  and fever.  HENT: Negative for ear pain and sore throat.   Eyes: Negative for pain and visual disturbance.  Respiratory: Positive for cough. Negative for shortness of breath.   Cardiovascular: Negative for chest pain and palpitations.  Gastrointestinal: Positive for nausea and vomiting. Negative for abdominal pain and diarrhea.  Genitourinary: Negative for dysuria and hematuria.  Musculoskeletal: Negative for arthralgias and back pain.  Skin: Negative for color change and rash.  Neurological: Positive for headaches. Negative for dizziness, syncope, weakness and numbness.  All other systems reviewed and are negative.    Physical Exam Triage Vital Signs ED  Triage Vitals  Enc Vitals Group     BP 03/14/20 1903 (!) 166/103     Pulse Rate 03/14/20 1903 76     Resp 03/14/20 1903 18     Temp 03/14/20 1903 (S) (!) 97.3 F (36.3 C)     Temp Source 03/14/20 1903 Oral     SpO2 03/14/20 1903 96 %     Weight --      Height --      Head Circumference --      Peak Flow --      Pain Score 03/14/20 1904 6     Pain Loc --      Pain Edu? --      Excl. in GC? --    No data found.  Updated Vital Signs BP (!) 166/103 (BP Location: Left Arm) Comment: reports taking BP meds before bed, last took last night  Pulse 76   Temp (S) (!) 97.3 F (36.3 C) (Oral)   Resp 18   SpO2 96%   Visual Acuity Right Eye Distance:   Left Eye Distance:   Bilateral Distance:    Right Eye Near:   Left Eye Near:    Bilateral Near:     Physical Exam Vitals and nursing note reviewed.  Constitutional:      General: She is not in acute distress.    Appearance: She is well-developed and well-nourished. She is not ill-appearing.  HENT:     Head: Normocephalic and atraumatic.     Right Ear: Tympanic membrane normal.     Left Ear: Tympanic membrane normal.     Nose: Nose normal.     Mouth/Throat:     Mouth: Mucous membranes are moist.     Pharynx: Oropharynx is clear.  Eyes:     Conjunctiva/sclera: Conjunctivae normal.  Cardiovascular:     Rate and Rhythm: Normal rate and regular rhythm.     Heart sounds: Normal heart sounds.  Pulmonary:     Effort: Pulmonary effort is normal. No respiratory distress.     Breath sounds: Normal breath sounds.  Abdominal:     General: Bowel sounds are normal.     Palpations: Abdomen is soft.     Tenderness: There is no abdominal tenderness. There is no guarding or rebound.  Musculoskeletal:        General: No edema.     Cervical back: Neck supple.  Skin:    General: Skin is warm and dry.  Neurological:     General: No focal deficit present.     Mental Status: She is alert and oriented to person, place, and time.     Gait:  Gait normal.  Psychiatric:        Mood and Affect: Mood and affect and mood normal.        Behavior: Behavior normal.      UC Treatments / Results  Labs (all labs ordered are listed, but only abnormal results are displayed) Labs Reviewed  SARS CORONAVIRUS 2 (TAT 6-24 HRS)    EKG   Radiology No results found.  Procedures Procedures (including critical care time)  Medications Ordered in UC Medications - No data to display  Initial Impression / Assessment and Plan / UC Course  I have reviewed the triage vital signs and the nursing notes.  Pertinent labs & imaging results that were available during my care of the patient were reviewed by me and considered in my medical decision making (see chart for details).   Viral illness.  Elevated blood pressure reading with known hypertension.  COVID pending.  Instructed patient to self quarantine until the test results are back.  Discussed symptomatic treatment including Tessalon Perles prn cough, Zofran prn nausea or vomiting, Tylenol, rest, hydration.  Instructed patient to follow up with PCP if her symptoms are not improving.  Discussed that her blood pressure is elevated today needs to be rechecked by her PCP in 1 to 2 weeks.  Patient agrees to plan of care.    Final Clinical Impressions(s) / UC Diagnoses   Final diagnoses:  Viral illness  Elevated blood pressure reading in office with diagnosis of hypertension     Discharge Instructions     Take the antinausea medication as directed.    Keep yourself hydrated with clear liquids, such as water, Gatorade, Pedialyte, Sprite, or ginger ale.    Take the [facility] as needed for cough.     Follow up with your primary care provider if your symptoms are not improving.    Your blood pressure is elevated today at 166/103.  Please have this rechecked by your primary care provider in 1-2 weeks.           ED Prescriptions    Medication Sig Dispense Auth. Provider    ondansetron (ZOFRAN ODT) 4 MG disintegrating tablet  (Status: Discontinued) Take 1 tablet (4 mg total) by mouth every 8 (eight) hours as needed for nausea or vomiting. 20 tablet Mickie Bail, NP   benzonatate (TESSALON) 100 MG capsule  (Status: Discontinued) Take 1 capsule (100 mg total) by mouth 3 (three) times daily as needed for cough. 21 capsule Mickie Bail, NP   benzonatate (TESSALON) 100 MG capsule Take 1 capsule (100 mg total) by mouth 3 (three) times daily as needed for cough. 21 capsule Mickie Bail, NP   ondansetron (ZOFRAN ODT) 4 MG disintegrating tablet Take 1 tablet (4 mg total) by mouth every 8 (eight) hours as needed for nausea or vomiting. 20 tablet Mickie Bail, NP     PDMP not reviewed this encounter.   Mickie Bail, NP 03/14/20 1955    Mickie Bail, NP 03/14/20 [phone]

## 2020-03-14 NOTE — ED Triage Notes (Signed)
Pt presents today with c/o of cough and headache x 2 weeks.

## 2020-03-15 ENCOUNTER — Telehealth (HOSPITAL_COMMUNITY): Payer: Self-pay

## 2020-03-15 NOTE — Telephone Encounter (Signed)
Pt called to receive lab results but they have not came back yet. Pt was informed to call back tomorrow concerning her results.

## 2020-03-16 NOTE — Telephone Encounter (Signed)
When patient called yesterday I informed her that I would give her a call concerning her Test results.  Staff was just checking to see if any results are in for patient.

## 2020-03-21 NOTE — Telephone Encounter (Signed)
Patient called for results, and this Rn notes that test was cancelled.  Reviewed with patient and she verbalized understanding that in order to get a result she will need to come back in for recollect.

## 2020-05-17 ENCOUNTER — Ambulatory Visit (HOSPITAL_COMMUNITY)
Admission: EM | Admit: 2020-05-17 | Discharge: 2020-05-17 | Disposition: A | Discharge status: Home / Self Care | Payer: 59 | Attending: Urgent Care | Admitting: Urgent Care

## 2020-05-17 ENCOUNTER — Other Ambulatory Visit: Payer: Self-pay

## 2020-05-17 ENCOUNTER — Encounter (HOSPITAL_COMMUNITY): Payer: Self-pay

## 2020-05-17 DIAGNOSIS — R509 Fever, unspecified: Secondary | ICD-10-CM | POA: Insufficient documentation

## 2020-05-17 DIAGNOSIS — R52 Pain, unspecified: Secondary | ICD-10-CM | POA: Diagnosis not present

## 2020-05-17 DIAGNOSIS — F17200 Nicotine dependence, unspecified, uncomplicated: Secondary | ICD-10-CM | POA: Diagnosis not present

## 2020-05-17 DIAGNOSIS — J029 Acute pharyngitis, unspecified: Secondary | ICD-10-CM | POA: Diagnosis not present

## 2020-05-17 DIAGNOSIS — B349 Viral infection, unspecified: Secondary | ICD-10-CM | POA: Diagnosis not present

## 2020-05-17 DIAGNOSIS — R059 Cough, unspecified: Secondary | ICD-10-CM | POA: Diagnosis not present

## 2020-05-17 DIAGNOSIS — Z20822 Contact with and (suspected) exposure to covid-19: Secondary | ICD-10-CM | POA: Diagnosis not present

## 2020-05-17 LAB — POCT RAPID STREP A, ED / UC: Streptococcus, Group A Screen (Direct): NEGATIVE

## 2020-05-17 LAB — POC INFLUENZA A AND B ANTIGEN (URGENT CARE ONLY)
INFLUENZA A ANTIGEN, POC: NEGATIVE
INFLUENZA B ANTIGEN, POC: NEGATIVE

## 2020-05-17 NOTE — ED Triage Notes (Signed)
Pt present coughing, body aches and sore throat with congestion. Symptom started yesterday.

## 2020-05-17 NOTE — Discharge Instructions (Signed)
You most likely have viral illness. We will contact you if your COVID test is positive.    You can take Tylenol and/or Ibuprofen as needed for fever reduction and pain relief.   You can use Flonase 2 sprays in each nostril daily.   For cough: honey 1/2 to 1 teaspoon (you can dilute the honey in water or another fluid).  You can use a humidifier for chest congestion and cough.  If you don't have a humidifier, you can sit in the bathroom with the hot shower running.    For sore throat: try warm salt water gargles, cepacol lozenges, throat spray, warm tea or water with lemon/honey, popsicles or ice, or OTC cold relief medicine for throat discomfort.    For congestion: take a daily anti-histamine like Zyrtec, Claritin, and a oral decongestant to help with post nasal drip that may be irritating your throat.    It is important to stay hydrated: drink plenty of fluids (water, gatorade/powerade/pedialyte, juices, or teas) to keep your throat moisturized and help further relieve irritation/discomfort.   Return or go to the Emergency Department if symptoms worsen or do not improve in the next few days.

## 2020-05-17 NOTE — ED Provider Notes (Signed)
[facility]    CSN: [id number] Arrival date & time: 05/17/20  1005      History   Chief Complaint Chief Complaint  Patient presents with  . Fever  . Sore Throat  . Generalized Body Aches    HPI Kristy Erickson is a 60 y.o. female.   Patient here for evaluation of nasal congestion, cough, sore throat, and body aches that started yesterday.  Patient reports diagnosed with seasonal allergies and given cetirizine and fluticasone nasal spray approximately 1 month ago.  Patient reports taking medications as prescribed.  Reports developing symptoms yesterday.  Other than prescriptions denies taking any medications. Denies any trauma, injury, or other precipitating event.  Denies any specific alleviating or aggravating factors.  Denies any chest pain, N/V/D, numbness, tingling, weakness, abdominal pain, or headaches.  ROS: As per HPI, all other pertinent ROS negative   The history is provided by the patient.  Fever Associated symptoms: congestion and sore throat   Sore Throat    Past Medical History:  Diagnosis Date  . Diabetes mellitus without complication (HCC)   . Hypertension     There are no problems to display for this patient.   History reviewed. No pertinent surgical history.  OB History   No obstetric history on file.      Home Medications    Prior to Admission medications   Medication Sig Start Date End Date Taking? Authorizing Provider  atorvastatin (LIPITOR) 80 MG tablet Take 80 mg by mouth daily. 11/21/18   [provider]  benzonatate (TESSALON) 100 MG capsule Take 1 capsule (100 mg total) by mouth 3 (three) times daily as needed for cough. 03/14/20   Mickie Bail, NP  Cetirizine HCl 10 MG CAPS Take 1 capsule (10 mg total) by mouth daily for 10 days. 05/04/19 05/14/19  Wieters, Hallie C, PA-C  cyclobenzaprine (FLEXERIL) 5 MG tablet Take 1 tablet (5 mg total) by mouth 3 (three) times daily as needed for muscle spasms. 01/28/20   Particia Nearing, PA-C  fluticasone [facility]) 50 MCG/ACT nasal spray Place 1-2 sprays into both nostrils daily for 7 days. 05/04/19 05/11/19  Wieters, Hallie C, PA-C  glimepiride (AMARYL) 4 MG tablet Take 4 mg by mouth daily. 01/14/19   [provider]  hydrochlorothiazide (HYDRODIURIL) 12.5 MG tablet Take 1 tablet (12.5 mg total) by mouth daily. 05/06/19 06/05/19  Joy, Shawn C, PA-C  lisinopril (ZESTRIL) 10 MG tablet Take 1 tablet (10 mg total) by mouth daily. 12/19/19 01/18/20  Orvil Feil, PA-C  metoprolol succinate (TOPROL-XL) 50 MG 24 hr tablet Take 50 mg by mouth daily. 11/21/18   [provider]  naproxen (NAPROSYN) 500 MG tablet Take 1 tablet (500 mg total) by mouth 2 (two) times daily as needed for headache. 05/04/19   Wieters, Hallie C, PA-C  ondansetron (ZOFRAN ODT) 4 MG disintegrating tablet Take 1 tablet (4 mg total) by mouth every 8 (eight) hours as needed for nausea or vomiting. 03/14/20   Mickie Bail, NP  pioglitazone (ACTOS) 30 MG tablet Take 1 tablet (30 mg total) by mouth daily. 05/04/19   Wieters, Hallie C, PA-C  predniSONE (DELTASONE) 20 MG tablet Take 2 tablets (40 mg total) by mouth daily with breakfast. 01/28/20   Particia Nearing, PA-C    Family History Family History  Family history unknown: Yes    Social History Social History   Tobacco Use  . Smoking status: Current Some Day Smoker  . Smokeless tobacco: Never  Used  Vaping Use  . Vaping Use: Never used  Substance Use Topics  . Alcohol use: Yes  . Drug use: Never     Allergies   Bee pollen and Amlodipine   Review of Systems Review of Systems  Constitutional: Positive for fever.  HENT: Positive for congestion and sore throat.   All other systems reviewed and are negative.    Physical Exam Triage Vital Signs ED Triage Vitals  Enc Vitals Group     BP 05/17/20 1037 (!) 169/91     Pulse Rate 05/17/20 1037 89     Resp 05/17/20 1037 18     Temp 05/17/20 1037 99.7 F (37.6 C)     Temp  Source 05/17/20 1037 Oral     SpO2 05/17/20 1037 100 %     Weight --      Height --      Head Circumference --      Peak Flow --      Pain Score 05/17/20 1039 10     Pain Loc --      Pain Edu? --      Excl. in GC? --    No data found.  Updated Vital Signs BP (!) 169/91 (BP Location: Left Arm)   Pulse 89   Temp 99.7 F (37.6 C) (Oral)   Resp 18   SpO2 100%   Visual Acuity Right Eye Distance:   Left Eye Distance:   Bilateral Distance:    Right Eye Near:   Left Eye Near:    Bilateral Near:     Physical Exam Vitals and nursing note reviewed.  Constitutional:      General: She is not in acute distress.    Appearance: Normal appearance. She is not ill-appearing, toxic-appearing or diaphoretic.  HENT:     Head: Normocephalic and atraumatic.     Mouth/Throat:     Mouth: No oral lesions.     Pharynx: Uvula midline. Posterior oropharyngeal erythema present. No oropharyngeal exudate or uvula swelling.     Tonsils: No tonsillar exudate or tonsillar abscesses. 1+ on the right. 1+ on the left.  Eyes:     Conjunctiva/sclera: Conjunctivae normal.  Cardiovascular:     Rate and Rhythm: Normal rate.     Pulses: Normal pulses.     Heart sounds: Normal heart sounds.  Pulmonary:     Effort: Pulmonary effort is normal.     Breath sounds: Decreased breath sounds present.  Abdominal:     General: Abdomen is flat.  Musculoskeletal:        General: Normal range of motion.     Cervical back: Normal range of motion.  Skin:    General: Skin is warm and dry.  Neurological:     General: No focal deficit present.     Mental Status: She is alert and oriented to person, place, and time.  Psychiatric:        Mood and Affect: Mood normal.      UC Treatments / Results  Labs (all labs ordered are listed, but only abnormal results are displayed) Labs Reviewed  SARS CORONAVIRUS 2 (TAT 6-24 HRS)  CULTURE, GROUP A STREP ([facility])  POC INFLUENZA A AND B ANTIGEN (URGENT CARE ONLY)  POCT  RAPID STREP A, ED / UC    EKG   Radiology No results found.  Procedures Procedures (including critical care time)  Medications Ordered in UC Medications - No data to display  Initial Impression / Assessment and Plan /  UC Course  I have reviewed the triage vital signs and the nursing notes.  Pertinent labs & imaging results that were available during my care of the patient were reviewed by me and considered in my medical decision making (see chart for details).    Assessment negative for red flags or concerns.  Rapid strep and point-of-care flu test negative in office.  COVID test pending.  Throat culture pending.  Encourage fluids and rest.  Discussed conservative symptom management as described below in discharge instructions.  Continue taking allergy medications as previously prescribed.  Follow up with primary care or go to the Emergency Department if symptoms worsen or do not improve in the next few days.   Final Clinical Impressions(s) / UC Diagnoses   Final diagnoses:  Cough  Viral illness     Discharge Instructions     You most likely have viral illness. We will contact you if your COVID test is positive.    You can take Tylenol and/or Ibuprofen as needed for fever reduction and pain relief.   You can use Flonase 2 sprays in each nostril daily.   For cough: honey 1/2 to 1 teaspoon (you can dilute the honey in water or another fluid).  You can use a humidifier for chest congestion and cough.  If you don't have a humidifier, you can sit in the bathroom with the hot shower running.    For sore throat: try warm salt water gargles, cepacol lozenges, throat spray, warm tea or water with lemon/honey, popsicles or ice, or OTC cold relief medicine for throat discomfort.    For congestion: take a daily anti-histamine like Zyrtec, Claritin, and a oral decongestant to help with post nasal drip that may be irritating your throat.    It is important to stay hydrated: drink  plenty of fluids (water, gatorade/powerade/pedialyte, juices, or teas) to keep your throat moisturized and help further relieve irritation/discomfort.   Return or go to the Emergency Department if symptoms worsen or do not improve in the next few days.      ED Prescriptions    None     PDMP not reviewed this encounter.   Ivette Loyal, NP 05/17/20 1141

## 2020-05-18 LAB — SARS CORONAVIRUS 2 (TAT 6-24 HRS): SARS Coronavirus 2: NEGATIVE

## 2020-05-19 LAB — CULTURE, GROUP A STREP (THRC)

## 2021-01-25 IMAGING — DX DG CHEST 1V PORT
1 series · 1 of 1 positions shown · non-contrast
Comparison: None.

CLINICAL DATA: Chest pain and cough.

EXAM:
PORTABLE CHEST 1 VIEW

[chest ap]
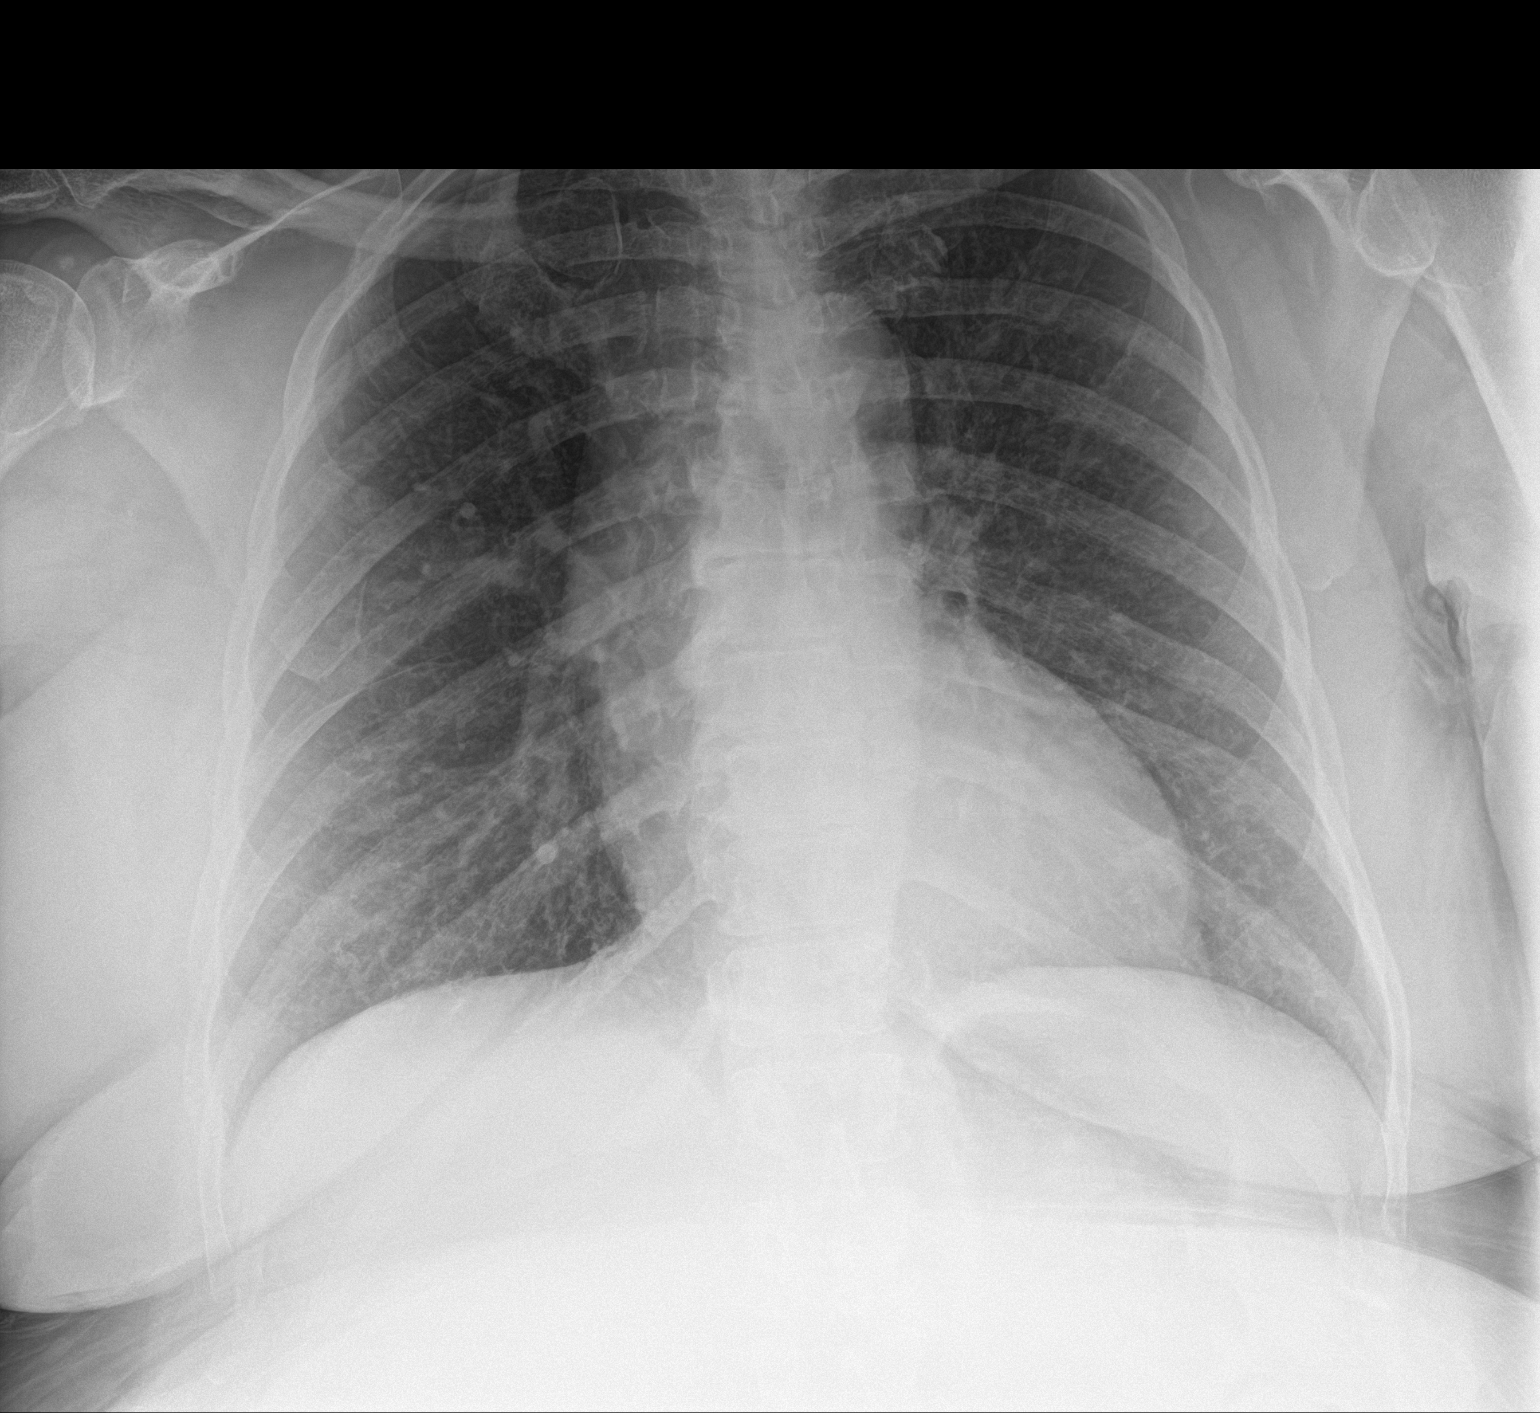

[1 of 1 positions shown; findings below may reference images not displayed]

FINDINGS: The heart size and mediastinal contours are within normal limits.
Both lungs are clear. The visualized skeletal structures are
unremarkable.
IMPRESSION: No active disease.
